# Patient Record
Sex: Female | Born: 2000 | Race: Black or African American | Hispanic: No | Marital: Single | Smoking: Never smoker
Health system: Southern US, Community
[De-identification: ages and names within clinical notes are randomized; demographics above are authoritative.]

## PROBLEM LIST (undated history)

## (undated) DIAGNOSIS — T148XXA Other injury of unspecified body region, initial encounter: Secondary | ICD-10-CM

## (undated) DIAGNOSIS — D649 Anemia, unspecified: Secondary | ICD-10-CM

---
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
## 2022-07-27 ENCOUNTER — Ambulatory Visit: Payer: Medicaid Other | Admitting: Student

## 2022-11-23 ENCOUNTER — Emergency Department (HOSPITAL_BASED_OUTPATIENT_CLINIC_OR_DEPARTMENT_OTHER): Payer: Medicaid Other | Admitting: Radiology

## 2022-11-23 ENCOUNTER — Emergency Department (HOSPITAL_BASED_OUTPATIENT_CLINIC_OR_DEPARTMENT_OTHER)
Admission: EM | Admit: 2022-11-23 | Discharge: 2022-11-23 | Disposition: A | Discharge status: Home / Self Care | Payer: Medicaid Other | Attending: Emergency Medicine | Admitting: Emergency Medicine

## 2022-11-23 ENCOUNTER — Other Ambulatory Visit: Payer: Self-pay

## 2022-11-23 ENCOUNTER — Encounter (HOSPITAL_BASED_OUTPATIENT_CLINIC_OR_DEPARTMENT_OTHER): Payer: Self-pay

## 2022-11-23 DIAGNOSIS — M79675 Pain in left toe(s): Secondary | ICD-10-CM | POA: Diagnosis present

## 2022-11-23 DIAGNOSIS — S93502A Unspecified sprain of left great toe, initial encounter: Secondary | ICD-10-CM | POA: Diagnosis not present

## 2022-11-23 DIAGNOSIS — S93509A Unspecified sprain of unspecified toe(s), initial encounter: Secondary | ICD-10-CM

## 2022-11-23 DIAGNOSIS — W231XXA Caught, crushed, jammed, or pinched between stationary objects, initial encounter: Secondary | ICD-10-CM | POA: Insufficient documentation

## 2022-11-23 NOTE — ED Triage Notes (Signed)
Patient here POV from Home.  Endorses running from a Swarm of Bees at 1100 today when she jammed her Left Large Toe into the Ground.   NAD noted during Triage. A&Ox4. GCS 15. Ambultory.

## 2022-11-23 NOTE — ED Provider Notes (Signed)
  [facility] Provider Note   CSN: [id number] Arrival date & time: 11/23/22  1420     History  Chief Complaint  Patient presents with   Toe Injury    Cassandra Alvarado is a 22 y.o. female.  HPI Patient presents with left great toe pain.  Had direct injury to it when it swarm of bees were flying around her.  States she jammed it into the ground.  Pain in left foot.  No other injury.  Was not stung.  Does have pain to walk.   Past Medical History:  Diagnosis Date   Anemia    Fracture    reports fractured left elbow at 22 yo.    Home Medications Prior to Admission medications   Medication Sig Start Date End Date Taking? Authorizing Provider  ondansetron (ZOFRAN-ODT) 4 MG disintegrating tablet Take 1 tablet (4 mg total) by mouth every 8 (eight) hours as needed for nausea or vomiting. 10/02/15   Junius Creamer, NP      Allergies    Patient has no known allergies.    Review of Systems   Review of Systems  Physical Exam Updated Vital Signs BP (!) 150/68 (BP Location: Right Arm)   Pulse (!) 101   Temp (!) 96.7 F (35.9 C) (Temporal)   Resp 18   Ht 5\' 8"  (1.727 m)   Wt 61.2 kg   SpO2 100%   BMI 20.53 kg/m  Physical Exam Vitals and nursing note reviewed.  Musculoskeletal:     Comments: Tenderness over IP joint on left great toe.  No tenderness around it.  No bleeding.  Does have artificial nails and cannot see under the nail.  Although less tenderness over distal phalanx.  Neurological:     Mental Status: She is alert.     ED Results / Procedures / Treatments   Labs (all labs ordered are listed, but only abnormal results are displayed) Labs Reviewed - No data to display  EKG None  Radiology DG Foot Complete Left  Result Date: 11/23/2022 CLINICAL DATA:  Left great toe injury. Jamming injury to the great toe. EXAM: LEFT FOOT - COMPLETE 3+ VIEW COMPARISON:  None Available. FINDINGS: There is no evidence of fracture or  dislocation. There is no evidence of arthropathy or other focal bone abnormality. Soft tissues are unremarkable. IMPRESSION: Negative. Electronically Signed   By: Lajean Manes M.D.   On: 11/23/2022 14:54    Procedures Procedures    Medications Ordered in ED Medications - No data to display  ED Course/ Medical Decision Making/ A&P                             Medical Decision Making Amount and/or Complexity of Data Reviewed Radiology: ordered.   Patient with left great toe injury.  Differential diagnosis includes jamming of the toe, fracture, sprain.  X-ray independently interpreted reassuring without fracture.  Cannot see under her through the nail but doubt severe injury.  Appears stable for discharge home.        Final Clinical Impression(s) / ED Diagnoses Final diagnoses:  Toe sprain, initial encounter    Rx / DC Orders ED Discharge Orders     None         Davonna Belling, MD 11/23/22 1523

## 2022-11-23 NOTE — ED Notes (Signed)
Pt discharged to home. Discharge instructions have been discussed with patient and/or family members. Pt verbally acknowledges understanding d/c instructions, and endorses comprehension to checkout at registration before leaving.  °

## 2022-11-23 NOTE — Discharge Instructions (Signed)
You likely sprained or overextended the joint on your big toe.  Motrin and Tylenol may help.  Follow-up with your doctor if symptoms do not improve.

## 2023-07-29 ENCOUNTER — Ambulatory Visit (HOSPITAL_COMMUNITY)
Admission: EM | Admit: 2023-07-29 | Discharge: 2023-07-29 | Disposition: A | Discharge status: Home / Self Care | Payer: Medicaid Other | Attending: Internal Medicine | Admitting: Internal Medicine

## 2023-07-29 ENCOUNTER — Encounter (HOSPITAL_COMMUNITY): Payer: Self-pay

## 2023-07-29 DIAGNOSIS — Z113 Encounter for screening for infections with a predominantly sexual mode of transmission: Secondary | ICD-10-CM | POA: Insufficient documentation

## 2023-07-29 DIAGNOSIS — N898 Other specified noninflammatory disorders of vagina: Secondary | ICD-10-CM | POA: Insufficient documentation

## 2023-07-29 NOTE — Discharge Instructions (Signed)
STD testing is pending.  We will call if anything is positive.  Please refrain from sexual activity until test results and treatment are complete.  Follow-up if lesion to vaginal area persists or worsens.

## 2023-07-29 NOTE — ED Provider Notes (Signed)
[facility]    CSN: [id number] Arrival date & time: 07/29/23  1839      History   Chief Complaint Chief Complaint  Patient presents with   Vaginal Discharge    HPI Cassandra Alvarado is a 22 y.o. female.   Patient presents today with vaginal discharge, vaginal itching, and a bump to the vaginal area that she noticed a few days ago.  Vaginal discharge is white in color.  She denies dysuria, urinary frequency, pelvic pain, fever.  Denies exposure to STD but patient has had unprotected sexual intercourse recently.  Last menstrual cycle was 07/14/2023.   Vaginal Discharge   Past Medical History:  Diagnosis Date   Anemia    Fracture    reports fractured left elbow at 22 yo.    There are no problems to display for this patient.   History reviewed. No pertinent surgical history.  OB History   No obstetric history on file.      Home Medications    Prior to Admission medications   Medication Sig Start Date End Date Taking? Authorizing Provider  ondansetron (ZOFRAN-ODT) 4 MG disintegrating tablet Take 1 tablet (4 mg total) by mouth every 8 (eight) hours as needed for nausea or vomiting. 10/02/15   Earley Favor, NP    Family History History reviewed. No pertinent family history.  Social History Social History   Tobacco Use   Smoking status: Never  Vaping Use   Vaping status: Never Used  Substance Use Topics   Alcohol use: Yes    Comment: Weekends   Drug use: No     Allergies   Patient has no known allergies.   Review of Systems Review of Systems Per HPI  Physical Exam Triage Vital Signs ED Triage Vitals  Encounter Vitals Group     BP 07/29/23 1941 122/78     Systolic BP Percentile --      Diastolic BP Percentile --      Pulse Rate 07/29/23 1941 95     Resp 07/29/23 1941 16     Temp 07/29/23 1941 98.3 F (36.8 C)     Temp Source 07/29/23 1941 Oral     SpO2 07/29/23 1941 98 %     Weight --      Height --      Head Circumference --       Peak Flow --      Pain Score 07/29/23 1944 0     Pain Loc --      Pain Education --      Exclude from Growth Chart --    No data found.  Updated Vital Signs BP 122/78 (BP Location: Left Arm)   Pulse 95   Temp 98.3 F (36.8 C) (Oral)   Resp 16   LMP 07/14/2023 (Approximate)   SpO2 98%   Visual Acuity Right Eye Distance:   Left Eye Distance:   Bilateral Distance:    Right Eye Near:   Left Eye Near:    Bilateral Near:     Physical Exam Exam conducted with a chaperone present.  Constitutional:      General: She is not in acute distress.    Appearance: Normal appearance. She is not toxic-appearing or diaphoretic.  HENT:     Head: Normocephalic and atraumatic.  Eyes:     Extraocular Movements: Extraocular movements intact.     Conjunctiva/sclera: Conjunctivae normal.  Pulmonary:     Effort: Pulmonary effort is normal.  Genitourinary:  Comments: There is a singular, flat, erythematous lesion that is pinpoint present to the mid right outer labia.  No drainage noted. Neurological:     General: No focal deficit present.     Mental Status: She is alert and oriented to person, place, and time. Mental status is at baseline.  Psychiatric:        Mood and Affect: Mood normal.        Behavior: Behavior normal.        Thought Content: Thought content normal.        Judgment: Judgment normal.      UC Treatments / Results  Labs (all labs ordered are listed, but only abnormal results are displayed) Labs Reviewed  HSV CULTURE AND TYPING  CERVICOVAGINAL ANCILLARY ONLY    EKG   Radiology No results found.  Procedures Procedures (including critical care time)  Medications Ordered in UC Medications - No data to display  Initial Impression / Assessment and Plan / UC Course  I have reviewed the triage vital signs and the nursing notes.  Pertinent labs & imaging results that were available during my care of the patient were reviewed by me and considered in my medical  decision making (see chart for details).     Cervicovaginal swab pending for vaginal discharge.  Will await results prior to treatment.  Lesion to vaginal area appears benign and possibly due to irritation.  Advised supportive care related to this.  No signs of bacterial infection to the lesion. Although, HSV swab is pending to rule this out.  Given it is not definitive for HSV, will await swab prior to any treatment.  Advised patient to refrain from sexual activity until test results and treatment are complete.  Patient verbalized understanding and was agreeable with plan. Final Clinical Impressions(s) / UC Diagnoses   Final diagnoses:  Vaginal discharge  Vaginal lesion  Screening examination for venereal disease     Discharge Instructions      STD testing is pending.  We will call if anything is positive.  Please refrain from sexual activity until test results and treatment are complete.  Follow-up if lesion to vaginal area persists or worsens.    ED Prescriptions   None    PDMP not reviewed this encounter.   Gustavus Bryant, [place] 07/29/23 2020

## 2023-07-29 NOTE — ED Triage Notes (Signed)
Here for vaginal discharge and itching.

## 2023-07-30 ENCOUNTER — Telehealth (HOSPITAL_COMMUNITY): Payer: Self-pay

## 2023-07-30 LAB — CERVICOVAGINAL ANCILLARY ONLY
Bacterial Vaginitis (gardnerella): POSITIVE — AB
Candida Glabrata: NEGATIVE
Candida Vaginitis: POSITIVE — AB
Chlamydia: NEGATIVE
Comment: NEGATIVE
Comment: NEGATIVE
Comment: NEGATIVE
Comment: NEGATIVE
Comment: NEGATIVE
Comment: NORMAL
Neisseria Gonorrhea: NEGATIVE
Trichomonas: NEGATIVE

## 2023-07-30 MED ORDER — METRONIDAZOLE 500 MG PO TABS: 500.0000 mg | ORAL_TABLET | Freq: Two times a day (BID) | ORAL | 0 refills | Status: AC

## 2023-07-30 MED ORDER — FLUCONAZOLE 150 MG PO TABS: 150.0000 mg | ORAL_TABLET | Freq: Once | ORAL | 0 refills | Status: AC

## 2023-07-30 NOTE — Telephone Encounter (Signed)
Per protocol, pt requires tx with metronidazole and diflucan.  Rx sent to pharmacy on file.

## 2023-08-01 LAB — HSV CULTURE AND TYPING

## 2023-11-14 ENCOUNTER — Ambulatory Visit
Admission: EM | Admit: 2023-11-14 | Discharge: 2023-11-14 | Disposition: A | Discharge status: Home / Self Care | Attending: Family Medicine | Admitting: Family Medicine

## 2023-11-14 DIAGNOSIS — S76912A Strain of unspecified muscles, fascia and tendons at thigh level, left thigh, initial encounter: Secondary | ICD-10-CM | POA: Diagnosis not present

## 2023-11-14 MED ORDER — CYCLOBENZAPRINE HCL 10 MG PO TABS: 10.0000 mg | ORAL_TABLET | Freq: Every evening | ORAL | 0 refills | Status: AC | PRN

## 2023-11-14 MED ORDER — NAPROXEN 500 MG PO TABS: 500.0000 mg | ORAL_TABLET | Freq: Two times a day (BID) | ORAL | 0 refills | Status: AC | PRN

## 2023-11-14 NOTE — ED Triage Notes (Signed)
 Patient presents to [facility] for left leg pain from doing a split last night. No OTC meds for pain. She believes she pulled a muscle. Reports tingling and numbness.

## 2023-11-14 NOTE — ED Provider Notes (Signed)
 [facility]    CSN: [id number] Arrival date & time: 11/14/23  1008      History   Chief Complaint Chief Complaint  Patient presents with   Leg Pain    HPI Cassandra Alvarado is a 23 y.o. female.   Patient here today with complaint of left upper thigh and leg pain after performing a split last night.  Endorses some paresthesias involving the left extremity.  Has not taken any over-the-counter medications.   Past Medical History:  Diagnosis Date   Anemia    Fracture    reports fractured left elbow at 23 yo.    There are no active problems to display for this patient.   History reviewed. No pertinent surgical history.  OB History   No obstetric history on file.      Home Medications    Prior to Admission medications   Medication Sig Start Date End Date Taking? Authorizing Provider  cyclobenzaprine (FLEXERIL) 10 MG tablet Take 1 tablet (10 mg total) by mouth at bedtime as needed (leg musle pain). 11/14/23  Yes Bing Neighbors, NP  naproxen (NAPROSYN) 500 MG tablet Take 1 tablet (500 mg total) by mouth 2 (two) times daily as needed for up to 20 days for moderate pain (pain score 4-6) (muscle pain). 11/14/23 12/04/23 Yes Bing Neighbors, NP  ondansetron (ZOFRAN-ODT) 4 MG disintegrating tablet Take 1 tablet (4 mg total) by mouth every 8 (eight) hours as needed for nausea or vomiting. 10/02/15   Earley Favor, NP    Family History History reviewed. No pertinent family history.  Social History Social History   Tobacco Use   Smoking status: Never  Vaping Use   Vaping status: Never Used  Substance Use Topics   Alcohol use: Yes    Comment: Weekends   Drug use: No     Allergies   Patient has no known allergies.   Review of Systems Review of Systems Pertinent negatives listed in HPI   Physical Exam Triage Vital Signs ED Triage Vitals  Encounter Vitals Group     BP 11/14/23 1026 110/71     Systolic BP Percentile --      Diastolic BP Percentile --       Pulse Rate 11/14/23 1026 86     Resp 11/14/23 1026 16     Temp 11/14/23 1026 98.1 F (36.7 C)     Temp Source 11/14/23 1026 Oral     SpO2 11/14/23 1026 99 %     Weight --      Height --      Head Circumference --      Peak Flow --      Pain Score 11/14/23 1025 10     Pain Loc --      Pain Education --      Exclude from Growth Chart --    No data found.  Updated Vital Signs BP 110/71 (BP Location: Right Arm)   Pulse 86   Temp 98.1 F (36.7 C) (Oral)   Resp 16   LMP 11/07/2023   SpO2 99%   Visual Acuity Right Eye Distance:   Left Eye Distance:   Bilateral Distance:    Right Eye Near:   Left Eye Near:    Bilateral Near:     Physical Exam Vitals reviewed.  Constitutional:      Appearance: Normal appearance.  HENT:     Head: Normocephalic and atraumatic.     Nose: Nose normal.  Eyes:  Extraocular Movements: Extraocular movements intact.     Pupils: Pupils are equal, round, and reactive to light.  Cardiovascular:     Rate and Rhythm: Normal rate and regular rhythm.     Pulses: Normal pulses.     Heart sounds: Normal heart sounds.  Pulmonary:     Effort: Pulmonary effort is normal.     Breath sounds: Normal breath sounds.  Musculoskeletal:     Comments: Minimal exam of left extremity as patient is intolerant of full MSK exam   Neurological:     General: No focal deficit present.     Mental Status: She is alert and oriented to person, place, and time.      Minimal exam, as patient is intolerant of full MSK exam  UC Treatments / Results  Labs (all labs ordered are listed, but only abnormal results are displayed) Labs Reviewed - No data to display  EKG   Radiology No results found.  Procedures Procedures (including critical care time)  Medications Ordered in UC Medications - No data to display  Initial Impression / Assessment and Plan / UC Course  I have reviewed the triage vital signs and the nursing notes.  Pertinent labs & imaging  results that were available during my care of the patient were reviewed by me and considered in my medical decision making (see chart for details).    Strain of the left thigh, treatment with NSAIDs-naproxen 500 mg twice daily as needed for leg pain related to muscle strain and inflammation and increasing range of motion as tolerated.  For acute pain Flexeril 10 mg at bedtime only.  Discussed with patient that Flexeril should not be taken at bedtime as it causes severe drowsiness.  Work note provided.  Return precautions given if symptoms worsen or do not improve. Final Clinical Impressions(s) / UC Diagnoses   Final diagnoses:  Muscle strain of left thigh, initial encounter   Discharge Instructions   None    ED Prescriptions     Medication Sig Dispense Auth. Provider   cyclobenzaprine (FLEXERIL) 10 MG tablet Take 1 tablet (10 mg total) by mouth at bedtime as needed (leg musle pain). 20 tablet Bing Neighbors, NP   naproxen (NAPROSYN) 500 MG tablet Take 1 tablet (500 mg total) by mouth 2 (two) times daily as needed for up to 20 days for moderate pain (pain score 4-6) (muscle pain). 30 tablet Bing Neighbors, NP      PDMP not reviewed this encounter.   Bing Neighbors, NP 11/14/23 1059

## 2024-07-24 ENCOUNTER — Inpatient Hospital Stay

## 2024-07-24 ENCOUNTER — Inpatient Hospital Stay: Attending: Oncology | Admitting: Oncology
